# Patient Record
Sex: Female | Born: 1995 | Hispanic: Yes | Marital: Single | Smoking: Never smoker
Health system: Southern US, Community
[De-identification: ages and names within clinical notes are randomized; demographics above are authoritative.]

## PROBLEM LIST (undated history)

## (undated) ENCOUNTER — Inpatient Hospital Stay (HOSPITAL_COMMUNITY): Payer: Self-pay

## (undated) DIAGNOSIS — Z789 Other specified health status: Secondary | ICD-10-CM

## (undated) HISTORY — PX: NO PAST SURGERIES: SHX2092

---

## 2017-11-18 NOTE — L&D Delivery Note (Addendum)
Patient is 22 y.o. 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   Delivery Note At 2:13 PM a viable female was delivered via Vaginal, Spontaneous (Presentation: vertex, OP).  APGAR: 8, 9; weight  pending.   Placenta status: spontaeous, intact.  Cord: 3VC    Anesthesia:  Epidural Episiotomy: None Lacerations: 2nd degree perineal Suture Repair: 2.0 Est. Blood Loss (mL):  50  Mom to postpartum.  Baby to Couplet care / Skin to Skin.  Upon arrival patient was complete and pushing. She pushed with good maternal effort to deliver a healthy baby boy. Baby delivered without difficulty, nuchal cord x1, delivered through, was noted to have good tone and place on maternal abdomen for oral suctioning, drying and stimulation. Delayed cord clamping performed. Placenta delivered intact with 3V cord. Vaginal canal and perineum was inspected and found to have a second degree perineal laceration which was repaired with 2-0 Vicryl rapide. Pitocin was started and uterus massaged until bleeding slowed. Counts of sharps, instruments, and lap pads were all correct.   Mirian MoPeter Frank, MD PGY-1 8/24/20193:31 PM  Midwife attestation: I was gloved and present for delivery in its entirety and I agree with the above resident's note.  Donette LarryMelanie Waldemar Siegel, CNM 4:16 PM

## 2018-04-27 ENCOUNTER — Encounter (HOSPITAL_COMMUNITY): Payer: Self-pay

## 2018-04-27 LAB — OB RESULTS CONSOLE HEPATITIS B SURFACE ANTIGEN: Hepatitis B Surface Ag: NEGATIVE

## 2018-04-27 LAB — OB RESULTS CONSOLE ABO/RH: RH Type: POSITIVE

## 2018-04-27 LAB — OB RESULTS CONSOLE GC/CHLAMYDIA
CHLAMYDIA, DNA PROBE: NEGATIVE
GC PROBE AMP, GENITAL: NEGATIVE

## 2018-04-27 LAB — OB RESULTS CONSOLE ANTIBODY SCREEN: ANTIBODY SCREEN: NEGATIVE

## 2018-04-27 LAB — OB RESULTS CONSOLE RPR: RPR: NONREACTIVE

## 2018-04-27 LAB — OB RESULTS CONSOLE HIV ANTIBODY (ROUTINE TESTING): HIV: NONREACTIVE

## 2018-04-27 LAB — OB RESULTS CONSOLE RUBELLA ANTIBODY, IGM: Rubella: IMMUNE

## 2018-05-07 ENCOUNTER — Other Ambulatory Visit: Payer: Self-pay | Admitting: Family Medicine

## 2018-05-07 DIAGNOSIS — Q048 Other specified congenital malformations of brain: Secondary | ICD-10-CM

## 2018-05-07 DIAGNOSIS — Z3A33 33 weeks gestation of pregnancy: Secondary | ICD-10-CM

## 2018-05-07 DIAGNOSIS — Z3689 Encounter for other specified antenatal screening: Secondary | ICD-10-CM

## 2018-05-07 DIAGNOSIS — G9389 Other specified disorders of brain: Secondary | ICD-10-CM

## 2018-05-12 ENCOUNTER — Encounter (HOSPITAL_COMMUNITY): Payer: Self-pay | Admitting: *Deleted

## 2018-05-14 ENCOUNTER — Ambulatory Visit (HOSPITAL_COMMUNITY)
Admission: RE | Admit: 2018-05-14 | Discharge: 2018-05-14 | Disposition: A | Discharge status: Home / Self Care | Payer: Medicaid Other | Source: Ambulatory Visit | Attending: Family Medicine | Admitting: Family Medicine

## 2018-05-14 ENCOUNTER — Other Ambulatory Visit (HOSPITAL_COMMUNITY): Payer: Self-pay | Admitting: *Deleted

## 2018-05-14 ENCOUNTER — Encounter (HOSPITAL_COMMUNITY): Payer: Self-pay

## 2018-05-14 DIAGNOSIS — Z363 Encounter for antenatal screening for malformations: Secondary | ICD-10-CM | POA: Insufficient documentation

## 2018-05-14 DIAGNOSIS — O0933 Supervision of pregnancy with insufficient antenatal care, third trimester: Secondary | ICD-10-CM | POA: Insufficient documentation

## 2018-05-14 DIAGNOSIS — G9389 Other specified disorders of brain: Secondary | ICD-10-CM

## 2018-05-14 DIAGNOSIS — Q048 Other specified congenital malformations of brain: Secondary | ICD-10-CM

## 2018-05-14 DIAGNOSIS — O358XX0 Maternal care for other (suspected) fetal abnormality and damage, not applicable or unspecified: Secondary | ICD-10-CM | POA: Diagnosis present

## 2018-05-14 DIAGNOSIS — Z3A33 33 weeks gestation of pregnancy: Secondary | ICD-10-CM

## 2018-05-14 DIAGNOSIS — Z3689 Encounter for other specified antenatal screening: Secondary | ICD-10-CM

## 2018-05-14 HISTORY — DX: Other specified health status: Z78.9

## 2018-06-08 LAB — OB RESULTS CONSOLE GBS: GBS: NEGATIVE

## 2018-06-08 LAB — OB RESULTS CONSOLE GC/CHLAMYDIA
Chlamydia: NEGATIVE
GC PROBE AMP, GENITAL: NEGATIVE

## 2018-06-12 ENCOUNTER — Ambulatory Visit (HOSPITAL_COMMUNITY)
Admission: RE | Admit: 2018-06-12 | Discharge: 2018-06-12 | Disposition: A | Discharge status: Home / Self Care | Payer: Medicaid Other | Source: Ambulatory Visit | Attending: Family Medicine | Admitting: Family Medicine

## 2018-06-12 ENCOUNTER — Encounter (HOSPITAL_COMMUNITY): Payer: Self-pay

## 2018-06-12 DIAGNOSIS — O358XX0 Maternal care for other (suspected) fetal abnormality and damage, not applicable or unspecified: Secondary | ICD-10-CM | POA: Diagnosis not present

## 2018-06-12 DIAGNOSIS — G9389 Other specified disorders of brain: Secondary | ICD-10-CM

## 2018-06-12 DIAGNOSIS — O0933 Supervision of pregnancy with insufficient antenatal care, third trimester: Secondary | ICD-10-CM

## 2018-06-12 DIAGNOSIS — Z3A37 37 weeks gestation of pregnancy: Secondary | ICD-10-CM | POA: Diagnosis not present

## 2018-06-12 DIAGNOSIS — Q048 Other specified congenital malformations of brain: Secondary | ICD-10-CM

## 2018-06-12 DIAGNOSIS — Z362 Encounter for other antenatal screening follow-up: Secondary | ICD-10-CM

## 2018-06-23 ENCOUNTER — Telehealth (HOSPITAL_COMMUNITY): Payer: Self-pay | Admitting: *Deleted

## 2018-06-23 ENCOUNTER — Encounter (HOSPITAL_COMMUNITY): Payer: Self-pay | Admitting: *Deleted

## 2018-06-23 NOTE — Telephone Encounter (Signed)
Preadmission screen Interpreter number [phone]

## 2018-06-24 ENCOUNTER — Observation Stay (HOSPITAL_COMMUNITY)
Admission: RE | Admit: 2018-06-24 | Discharge: 2018-06-24 | Disposition: A | Discharge status: Home / Self Care | Payer: Self-pay | Source: Ambulatory Visit | Attending: Family Medicine | Admitting: Family Medicine

## 2018-06-24 ENCOUNTER — Encounter (HOSPITAL_COMMUNITY): Payer: Self-pay

## 2018-06-24 DIAGNOSIS — Z3A38 38 weeks gestation of pregnancy: Secondary | ICD-10-CM | POA: Insufficient documentation

## 2018-06-24 DIAGNOSIS — Z0379 Encounter for other suspected maternal and fetal conditions ruled out: Principal | ICD-10-CM | POA: Insufficient documentation

## 2018-06-24 LAB — CBC
HEMATOCRIT: 34.3 % — AB (ref 36.0–46.0)
Hemoglobin: 11.5 g/dL — ABNORMAL LOW (ref 12.0–15.0)
MCH: 28.1 pg (ref 26.0–34.0)
MCHC: 33.5 g/dL (ref 30.0–36.0)
MCV: 83.9 fL (ref 78.0–100.0)
Platelets: 200 10*3/uL (ref 150–400)
RBC: 4.09 MIL/uL (ref 3.87–5.11)
RDW: 14.8 % (ref 11.5–15.5)
WBC: 8.4 10*3/uL (ref 4.0–10.5)

## 2018-06-24 LAB — TYPE AND SCREEN
ABO/RH(D): A POS
ANTIBODY SCREEN: NEGATIVE

## 2018-06-24 LAB — ABO/RH: ABO/RH(D): A POS

## 2018-06-24 LAB — RPR: RPR Ser Ql: NONREACTIVE

## 2018-06-24 MED ORDER — TERBUTALINE SULFATE 1 MG/ML IJ SOLN
0.2500 mg | Freq: Once | INTRAMUSCULAR | Status: DC
  Filled 2018-06-24: qty 1

## 2018-06-24 MED ORDER — LACTATED RINGERS IV SOLN: INTRAVENOUS | Status: DC

## 2018-06-24 NOTE — H&P (Signed)
HISTORY AND PHYSICAL  Kristin Cobb is a 22 y.o. female G2P1001 with IUP at [redacted]w[redacted]d presented for External Cephalic Version  Prenatal History/Complications:  Past Medical History: Past Medical History:  Diagnosis Date  . Medical history non-contributory     Past Surgical History: Past Surgical History:  Procedure Laterality Date  . NO PAST SURGERIES      Obstetrical History: OB History    Gravida  2   Para  1   Term  1   Preterm      AB      Living  1     SAB      TAB      Ectopic      Multiple      Live Births  1           Social History: Social History   Socioeconomic History  . Marital status: Single    Spouse name: Not on file  . Number of children: Not on file  . Years of education: Not on file  . Highest education level: Not on file  Occupational History  . Not on file  Social Needs  . Financial resource strain: Not on file  . Food insecurity:    Worry: Not on file    Inability: Not on file  . Transportation needs:    Medical: Not on file    Non-medical: Not on file  Tobacco Use  . Smoking status: Never Smoker  . Smokeless tobacco: Never Used  Substance and Sexual Activity  . Alcohol use: Not Currently  . Drug use: Never  . Sexual activity: Not Currently    Birth control/protection: None  Lifestyle  . Physical activity:    Days per week: Not on file    Minutes per session: Not on file  . Stress: Not on file  Relationships  . Social connections:    Talks on phone: Not on file    Gets together: Not on file    Attends religious service: Not on file    Active member of club or organization: Not on file    Attends meetings of clubs or organizations: Not on file    Relationship status: Not on file  Other Topics Concern  . Not on file  Social History Narrative  . Not on file    Family History: Family History  Problem Relation Age of Onset  . Diabetes Mother     Allergies: No Known Allergies  Medications Prior  to Admission  Medication Sig Dispense Refill Last Dose  . Prenatal Vit w/Fe-Methylfol-FA (PNV PO) Take by mouth.   Taking     Review of Systems   All systems reviewed and negative except as stated in HPI  Blood pressure 102/69, pulse 88, temperature 97.6 F (36.4 C), temperature source Oral, resp. rate 16, height 5\' 5"  (1.651 m), weight 147 lb 5 oz (66.8 kg), last menstrual period 09/25/2017. General appearance: alert and cooperative Lungs: clear to auscultation bilaterally Heart: regular rate and rhythm Abdomen: soft, non-tender; bowel sounds normal. Gravid Extremities: Homans sign is negative, no sign of DVT  Presentation: cephalic- performed BSUS that showed fetus was cephalic Fetal monitoringBaseline: 145 bpm, Variability: Good {> 6 bpm), Accelerations: Reactive and Decelerations: Absent Uterine activityNone   Prenatal labs: ABO, Rh: --/--/A POS (08/07 0454) Antibody: PENDING (08/07 0981) Rubella: Immune (06/10 0000) RPR: Nonreactive (06/10 0000)  HBsAg: Negative (06/10 0000)  HIV: Non-reactive (06/10 0000)  GBS: Negative (07/22 0000)   Labs:  Results for orders placed or performed during the hospital encounter of 06/24/18 (from the past 24 hour(s))  CBC   Collection Time: 06/24/18  8:19 AM  Result Value Ref Range   WBC 8.4 4.0 - 10.5 K/uL   RBC 4.09 3.87 - 5.11 MIL/uL   Hemoglobin 11.5 (L) 12.0 - 15.0 g/dL   HCT 16.134.3 (L) 09.636.0 - 04.546.0 %   MCV 83.9 78.0 - 100.0 fL   MCH 28.1 26.0 - 34.0 pg   MCHC 33.5 30.0 - 36.0 g/dL   RDW 40.914.8 81.111.5 - 91.415.5 %   Platelets 200 150 - 400 K/uL  Type and screen [facility]   Collection Time: 06/24/18  8:19 AM  Result Value Ref Range   ABO/RH(D) A POS    Antibody Screen PENDING    Sample Expiration      06/27/2018 Performed at [facility], [address] [place], [place] [address]     Assessment/Plan:  Kristin Cobb is a 22 y.o. G2P1001 at 4225w6d here for ECV  #ECV- not necessary due to fetus  being vertex by US. Discussed with patient labor precautions and reasons to return to hospital. Reviewed possible IOL if she has not delivered by 8/22 (41wk). Utilized interpreter to assure understanding.   Federico FlakeKimberly Niles Newton, MD  06/24/2018, 9:50 AM

## 2018-06-24 NOTE — Discharge Summary (Signed)
Discharge Summary  Admit 06/24/18 Discharge: 06/24/18   Kristin MunroMaria Dominguez Torres is G2P1001 seen on LD for ECv given tranverse position on US 8/6. Today infant is vertex. No ECV needed.    Discharged: Home Diet: no restrictions

## 2018-06-24 NOTE — Discharge Instructions (Signed)
Come to the MAU (maternity admission unit) for 1) Strong contractions every 2-3 minutes for at least 1 hour that do not go away when you drink water or take a warm shower. These contractions will be so strong all you can do is breath through them 2) Vaginal bleeding- anything more than spotting 3) Loss of fluid like you broke your water 4) Decreased movement of your baby    Kristin PeltonBraxton Hicks Contractions Contractions of the uterus can occur throughout pregnancy, but they are not always a sign that you are in labor. You may have practice contractions called Braxton Hicks contractions. These false labor contractions are sometimes confused with true labor. What are Kristin PeltonBraxton Hicks contractions? Braxton Hicks contractions are tightening movements that occur in the muscles of the uterus before labor. Unlike true labor contractions, these contractions do not result in opening (dilation) and thinning of the cervix. Toward the end of pregnancy (32-34 weeks), Braxton Hicks contractions can happen more often and may become stronger. These contractions are sometimes difficult to tell apart from true labor because they can be very uncomfortable. You should not feel embarrassed if you go to the hospital with false labor. Sometimes, the only way to tell if you are in true labor is for your health care provider to look for changes in the cervix. The health care provider will do a physical exam and may monitor your contractions. If you are not in true labor, the exam should show that your cervix is not dilating and your water has not broken. If there are other health problems associated with your pregnancy, it is completely safe for you to be sent home with false labor. You may continue to have Braxton Hicks contractions until you go into true labor. How to tell the difference between true labor and false labor True labor  Contractions last 30-70 seconds.  Contractions become very regular.  Discomfort is usually felt  in the top of the uterus, and it spreads to the lower abdomen and low back.  Contractions do not go away with walking.  Contractions usually become more intense and increase in frequency.  The cervix dilates and gets thinner. False labor  Contractions are usually shorter and not as strong as true labor contractions.  Contractions are usually irregular.  Contractions are often felt in the front of the lower abdomen and in the groin.  Contractions may go away when you walk around or change positions while lying down.  Contractions get weaker and are shorter-lasting as time goes on.  The cervix usually does not dilate or become thin. Follow these instructions at home:  Take over-the-counter and prescription medicines only as told by your health care provider.  Keep up with your usual exercises and follow other instructions from your health care provider.  Eat and drink lightly if you think you are going into labor.  If Braxton Hicks contractions are making you uncomfortable: ? Change your position from lying down or resting to walking, or change from walking to resting. ? Sit and rest in a tub of warm water. ? Drink enough fluid to keep your urine pale yellow. Dehydration may cause these contractions. ? Do slow and deep breathing several times an hour.  Keep all follow-up prenatal visits as told by your health care provider. This is important. Contact a health care provider if:  You have a fever.  You have continuous pain in your abdomen. Get help right away if:  Your contractions become stronger, more regular, and closer  together.  You have fluid leaking or gushing from your vagina.  You pass blood-tinged mucus (bloody show).  You have bleeding from your vagina.  You have low back pain that you never had before.  You feel your babys head pushing down and causing pelvic pressure.  Your baby is not moving inside you as much as it used to. Summary  Contractions  that occur before labor are called Braxton Hicks contractions, false labor, or practice contractions.  Braxton Hicks contractions are usually shorter, weaker, farther apart, and less regular than true labor contractions. True labor contractions usually become progressively stronger and regular and they become more frequent.  Manage discomfort from [facility] contractions by changing position, resting in a warm bath, drinking plenty of water, or practicing deep breathing. This information is not intended to replace advice given to you by your health care provider. Make sure you discuss any questions you have with your health care provider. Document Released: 03/20/2017 Document Revised: 03/20/2017 Document Reviewed: 03/20/2017 Elsevier Interactive Patient Education  2018 [employer].

## 2018-07-06 ENCOUNTER — Telehealth (HOSPITAL_COMMUNITY): Payer: Self-pay | Admitting: *Deleted

## 2018-07-06 NOTE — Telephone Encounter (Signed)
Preadmission screen Interpreter [phone]

## 2018-07-11 ENCOUNTER — Other Ambulatory Visit: Payer: Self-pay

## 2018-07-11 ENCOUNTER — Inpatient Hospital Stay (HOSPITAL_COMMUNITY): Payer: Medicaid Other | Admitting: Anesthesiology

## 2018-07-11 ENCOUNTER — Inpatient Hospital Stay (HOSPITAL_COMMUNITY)
Admission: RE | Admit: 2018-07-11 | Discharge: 2018-07-13 | DRG: 807 | Disposition: A | Discharge status: Home / Self Care | Payer: Medicaid Other | Attending: Obstetrics and Gynecology | Admitting: Obstetrics and Gynecology

## 2018-07-11 ENCOUNTER — Encounter (HOSPITAL_COMMUNITY): Payer: Self-pay

## 2018-07-11 DIAGNOSIS — D649 Anemia, unspecified: Secondary | ICD-10-CM | POA: Diagnosis present

## 2018-07-11 DIAGNOSIS — O480 Post-term pregnancy: Principal | ICD-10-CM | POA: Diagnosis present

## 2018-07-11 DIAGNOSIS — Z141 Cystic fibrosis carrier: Secondary | ICD-10-CM | POA: Diagnosis not present

## 2018-07-11 DIAGNOSIS — Z3A41 41 weeks gestation of pregnancy: Secondary | ICD-10-CM | POA: Diagnosis present

## 2018-07-11 DIAGNOSIS — O99019 Anemia complicating pregnancy, unspecified trimester: Secondary | ICD-10-CM | POA: Diagnosis present

## 2018-07-11 DIAGNOSIS — O9902 Anemia complicating childbirth: Secondary | ICD-10-CM | POA: Diagnosis present

## 2018-07-11 LAB — TYPE AND SCREEN
ABO/RH(D): A POS
Antibody Screen: NEGATIVE

## 2018-07-11 LAB — CBC
HCT: 33.7 % — ABNORMAL LOW (ref 36.0–46.0)
HEMOGLOBIN: 11.5 g/dL — AB (ref 12.0–15.0)
MCH: 28.2 pg (ref 26.0–34.0)
MCHC: 34.1 g/dL (ref 30.0–36.0)
MCV: 82.6 fL (ref 78.0–100.0)
PLATELETS: 208 10*3/uL (ref 150–400)
RBC: 4.08 MIL/uL (ref 3.87–5.11)
RDW: 15 % (ref 11.5–15.5)
WBC: 8.2 10*3/uL (ref 4.0–10.5)

## 2018-07-11 LAB — RPR: RPR Ser Ql: NONREACTIVE

## 2018-07-11 MED ORDER — TERBUTALINE SULFATE 1 MG/ML IJ SOLN
0.2500 mg | Freq: Once | INTRAMUSCULAR | Status: DC | PRN
  Filled 2018-07-11: qty 1

## 2018-07-11 MED ORDER — BENZOCAINE-MENTHOL 20-0.5 % EX AERO
1.0000 "application " | INHALATION_SPRAY | CUTANEOUS | Status: DC | PRN
  Administered 2018-07-11: 1 via TOPICAL
  Filled 2018-07-11: qty 56

## 2018-07-11 MED ORDER — PHENYLEPHRINE 40 MCG/ML (10ML) SYRINGE FOR IV PUSH (FOR BLOOD PRESSURE SUPPORT)
80.0000 ug | PREFILLED_SYRINGE | INTRAVENOUS | Status: DC | PRN
  Filled 2018-07-11: qty 5

## 2018-07-11 MED ORDER — MISOPROSTOL 25 MCG QUARTER TABLET: 25.0000 ug | ORAL_TABLET | ORAL | Status: DC | PRN

## 2018-07-11 MED ORDER — EPHEDRINE 5 MG/ML INJ
10.0000 mg | INTRAVENOUS | Status: DC | PRN
  Filled 2018-07-11: qty 2

## 2018-07-11 MED ORDER — DIBUCAINE 1 % RE OINT: 1.0000 "application " | TOPICAL_OINTMENT | RECTAL | Status: DC | PRN

## 2018-07-11 MED ORDER — SOD CITRATE-CITRIC ACID 500-334 MG/5ML PO SOLN: 30.0000 mL | ORAL | Status: DC | PRN

## 2018-07-11 MED ORDER — DIPHENHYDRAMINE HCL 25 MG PO CAPS: 25.0000 mg | ORAL_CAPSULE | Freq: Four times a day (QID) | ORAL | Status: DC | PRN

## 2018-07-11 MED ORDER — LIDOCAINE HCL (PF) 1 % IJ SOLN
INTRAMUSCULAR | Status: DC | PRN
  Administered 2018-07-11 (×2): 5 mL via EPIDURAL

## 2018-07-11 MED ORDER — ONDANSETRON HCL 4 MG/2ML IJ SOLN: 4.0000 mg | INTRAMUSCULAR | Status: DC | PRN

## 2018-07-11 MED ORDER — ACETAMINOPHEN 325 MG PO TABS: 650.0000 mg | ORAL_TABLET | ORAL | Status: DC | PRN

## 2018-07-11 MED ORDER — SENNOSIDES-DOCUSATE SODIUM 8.6-50 MG PO TABS
2.0000 | ORAL_TABLET | ORAL | Status: DC
  Administered 2018-07-11 – 2018-07-12 (×2): 2 via ORAL
  Filled 2018-07-11 (×2): qty 2

## 2018-07-11 MED ORDER — LIDOCAINE HCL (PF) 1 % IJ SOLN
30.0000 mL | INTRAMUSCULAR | Status: DC | PRN
  Filled 2018-07-11: qty 30

## 2018-07-11 MED ORDER — SIMETHICONE 80 MG PO CHEW: 80.0000 mg | CHEWABLE_TABLET | ORAL | Status: DC | PRN

## 2018-07-11 MED ORDER — OXYTOCIN 40 UNITS IN LACTATED RINGERS INFUSION - SIMPLE MED
2.5000 [IU]/h | INTRAVENOUS | Status: DC
  Filled 2018-07-11: qty 1000

## 2018-07-11 MED ORDER — MISOPROSTOL 50MCG HALF TABLET
50.0000 ug | ORAL_TABLET | ORAL | Status: DC | PRN
  Administered 2018-07-11: 50 ug via ORAL
  Filled 2018-07-11 (×2): qty 1

## 2018-07-11 MED ORDER — WITCH HAZEL-GLYCERIN EX PADS: 1.0000 "application " | MEDICATED_PAD | CUTANEOUS | Status: DC | PRN

## 2018-07-11 MED ORDER — DIPHENHYDRAMINE HCL 50 MG/ML IJ SOLN: 12.5000 mg | INTRAMUSCULAR | Status: DC | PRN

## 2018-07-11 MED ORDER — OXYCODONE-ACETAMINOPHEN 5-325 MG PO TABS: 1.0000 | ORAL_TABLET | ORAL | Status: DC | PRN

## 2018-07-11 MED ORDER — ZOLPIDEM TARTRATE 5 MG PO TABS: 5.0000 mg | ORAL_TABLET | Freq: Every evening | ORAL | Status: DC | PRN

## 2018-07-11 MED ORDER — FENTANYL CITRATE (PF) 100 MCG/2ML IJ SOLN: 100.0000 ug | INTRAMUSCULAR | Status: DC | PRN

## 2018-07-11 MED ORDER — OXYTOCIN 40 UNITS IN LACTATED RINGERS INFUSION - SIMPLE MED
1.0000 m[IU]/min | INTRAVENOUS | Status: DC
  Administered 2018-07-11: 2 m[IU]/min via INTRAVENOUS

## 2018-07-11 MED ORDER — LACTATED RINGERS IV SOLN
INTRAVENOUS | Status: DC
  Administered 2018-07-11 (×3): via INTRAVENOUS

## 2018-07-11 MED ORDER — PRENATAL MULTIVITAMIN CH
1.0000 | ORAL_TABLET | Freq: Every day | ORAL | Status: DC
  Administered 2018-07-12: 1 via ORAL
  Filled 2018-07-11: qty 1

## 2018-07-11 MED ORDER — ONDANSETRON HCL 4 MG PO TABS: 4.0000 mg | ORAL_TABLET | ORAL | Status: DC | PRN

## 2018-07-11 MED ORDER — ONDANSETRON HCL 4 MG/2ML IJ SOLN: 4.0000 mg | Freq: Four times a day (QID) | INTRAMUSCULAR | Status: DC | PRN

## 2018-07-11 MED ORDER — OXYCODONE-ACETAMINOPHEN 5-325 MG PO TABS: 2.0000 | ORAL_TABLET | ORAL | Status: DC | PRN

## 2018-07-11 MED ORDER — LACTATED RINGERS IV SOLN
500.0000 mL | INTRAVENOUS | Status: DC | PRN
  Administered 2018-07-11: 500 mL via INTRAVENOUS

## 2018-07-11 MED ORDER — PHENYLEPHRINE 40 MCG/ML (10ML) SYRINGE FOR IV PUSH (FOR BLOOD PRESSURE SUPPORT)
PREFILLED_SYRINGE | INTRAVENOUS | Status: AC
  Filled 2018-07-11: qty 20

## 2018-07-11 MED ORDER — COCONUT OIL OIL: 1.0000 "application " | TOPICAL_OIL | Status: DC | PRN

## 2018-07-11 MED ORDER — OXYTOCIN BOLUS FROM INFUSION
500.0000 mL | Freq: Once | INTRAVENOUS | Status: AC
  Administered 2018-07-11: 500 mL via INTRAVENOUS

## 2018-07-11 MED ORDER — FENTANYL 2.5 MCG/ML BUPIVACAINE 1/10 % EPIDURAL INFUSION (WH - ANES)
14.0000 mL/h | INTRAMUSCULAR | Status: DC | PRN
  Administered 2018-07-11: 14 mL/h via EPIDURAL

## 2018-07-11 MED ORDER — FENTANYL 2.5 MCG/ML BUPIVACAINE 1/10 % EPIDURAL INFUSION (WH - ANES)
INTRAMUSCULAR | Status: AC
  Filled 2018-07-11: qty 100

## 2018-07-11 MED ORDER — LACTATED RINGERS IV SOLN: 500.0000 mL | Freq: Once | INTRAVENOUS | Status: DC

## 2018-07-11 MED ORDER — TETANUS-DIPHTH-ACELL PERTUSSIS 5-2.5-18.5 LF-MCG/0.5 IM SUSP: 0.5000 mL | Freq: Once | INTRAMUSCULAR | Status: DC

## 2018-07-11 MED ORDER — IBUPROFEN 600 MG PO TABS
600.0000 mg | ORAL_TABLET | Freq: Four times a day (QID) | ORAL | Status: DC
  Administered 2018-07-11 – 2018-07-13 (×8): 600 mg via ORAL
  Filled 2018-07-11 (×8): qty 1

## 2018-07-11 NOTE — Progress Notes (Signed)
[employer] Tool WorksSpanish Interpreter, Dominque [phone] on the line

## 2018-07-11 NOTE — Progress Notes (Signed)
Spanish Interpreter Gloriann LoanJanina [phone] on the line to see if the patient had any needs/questions for the induction process thus far

## 2018-07-11 NOTE — Progress Notes (Signed)
Spanish interpreter, Dois DavenportSandra [phone], on the line to discuss POC with Clelia CroftShaw, CNM and for foley bulb placement

## 2018-07-11 NOTE — Anesthesia Preprocedure Evaluation (Signed)
Anesthesia Evaluation  Patient identified by MRN, date of birth, ID band Patient awake    Reviewed: Allergy & Precautions, H&P , NPO status , Patient's Chart, lab work & pertinent test results  Airway Mallampati: I  TM Distance: >3 FB Neck ROM: full    Dental no notable dental hx. (+) Teeth Intact   Pulmonary neg pulmonary ROS,    Pulmonary exam normal breath sounds clear to auscultation       Cardiovascular negative cardio ROS Normal cardiovascular exam Rhythm:regular Rate:Normal     Neuro/Psych negative neurological ROS  negative psych ROS   GI/Hepatic negative GI ROS, Neg liver ROS,   Endo/Other  negative endocrine ROS  Renal/GU negative Renal ROS  negative genitourinary   Musculoskeletal negative musculoskeletal ROS (+)   Abdominal Normal abdominal exam  (+)   Peds  Hematology  (+) anemia ,   Anesthesia Other Findings   Reproductive/Obstetrics (+) Pregnancy                             Anesthesia Physical Anesthesia Plan  ASA: II  Anesthesia Plan: Epidural   Post-op Pain Management:    Induction:   PONV Risk Score and Plan:   Airway Management Planned:   Additional Equipment:   Intra-op Plan:   Post-operative Plan:   Informed Consent: I have reviewed the patients History and Physical, chart, labs and discussed the procedure including the risks, benefits and alternatives for the proposed anesthesia with the patient or authorized representative who has indicated his/her understanding and acceptance.     Plan Discussed with:   Anesthesia Plan Comments:         Anesthesia Quick Evaluation

## 2018-07-11 NOTE — Progress Notes (Signed)
Interpreter Rozetta NunneryYendry [phone] on the line for admission process

## 2018-07-11 NOTE — Anesthesia Pain Management Evaluation Note (Signed)
  CRNA Pain Management Visit Note  Patient: Kristin Cobb, 22 y.o., female  "Hello I am a member of the anesthesia team at [facility]. We have an anesthesia team available at all times to provide care throughout the hospital, including epidural management and anesthesia for C-section. I don't know your plan for the delivery whether it a natural birth, water birth, IV sedation, nitrous supplementation, doula or epidural, but we want to meet your pain goals."   1.Was your pain managed to your expectations on prior hospitalizations?   Yes   2.What is your expectation for pain management during this hospitalization?     Epidural  3.How can we help you reach that goal? Support prn   Record the patient's initial score and the patient's pain goal.   Pain: 4  Pain Goal: 6 [facility] wants you to be able to say your pain was always managed very well.  [facility] LLCWRINKLE,Spiro Ausborn 07/11/2018

## 2018-07-11 NOTE — Progress Notes (Signed)
Patient ID: [name], female   DOB: 11/15/1996, 22 y.o.   MRN: [id number]   Visit conducted with interpreter  Pt having some cramping in low abd  BP 108/70, P 89 FHR 150s, +accels, no decels Ctx irreg Cx 1+/70/vtx -2  IUP@term  Cx unfavorable  Cervical foley inserted without difficulty and inflated with 60cc fluid Plan on Pit when it comes out  Cam HaiSHAW, Likisha Alles CNM 07/11/2018 6:40 AM

## 2018-07-11 NOTE — Anesthesia Procedure Notes (Signed)
Epidural Patient location during procedure: OB Start time: 07/11/2018 9:54 AM End time: 07/11/2018 9:58 AM  Staffing Anesthesiologist: Leilani AbleHatchett, Maretta Overdorf, MD Performed: anesthesiologist   Preanesthetic Checklist Completed: patient identified, site marked, surgical consent, pre-op evaluation, timeout performed, IV checked, risks and benefits discussed and monitors and equipment checked  Epidural Patient position: sitting Prep: site prepped and draped and DuraPrep Patient monitoring: continuous pulse ox and blood pressure Approach: midline Location: L3-L4 Injection technique: LOR air  Needle:  Needle type: Tuohy  Needle gauge: 17 G Needle length: 9 cm and 9 Needle insertion depth: 4 cm Catheter type: closed end flexible Catheter size: 19 Gauge Catheter at skin depth: 9 cm Test dose: negative and Other  Assessment Sensory level: T10 Events: blood not aspirated, injection not painful, no injection resistance, negative IV test and no paresthesia  Additional Notes Reason for block:procedure for pain

## 2018-07-11 NOTE — H&P (Addendum)
Kristin MunroMaria Dominguez Cobb is a 22 y.o. female G2P1001 @ 41.2wks presenting for IOL for post dates. Denies leaking or bldg; no N/V, H/A or visual disturbances.  *Patient needs spanish interpretor OB History    Gravida  2   Para  1   Term  1   Preterm      AB      Living  1     SAB      TAB      Ectopic      Multiple      Live Births  1          Past Medical History:  Diagnosis Date  . Medical history non-contributory    Past Surgical History:  Procedure Laterality Date  . NO PAST SURGERIES     Family History: family history includes Diabetes in her mother. Social History:  reports that she has never smoked. She has never used smokeless tobacco. She reports that she drank alcohol. She reports that she does not use drugs.     Maternal Diabetes: No Genetic Screening: Abnormal:  Results: Other: CF carrier, too late for other screens Maternal Ultrasounds/Referrals: Abnormal:  Findings:   Other: increased cisterna magna, note had said refer to MFM but no record of that visit in EPIC Fetal Ultrasounds or other Referrals:  None Maternal Substance Abuse:  No Significant Maternal Medications:  None Significant Maternal Lab Results:  None Other Comments:  hx of transverse lie, was admitted for version by Dr. Alvester MorinNewton and found to be vertex so discharged  ROS History Dilation: Fingertip Effacement (%): Thick Station: -3, -2 Exam by:: Bland, DO Blood pressure 117/73, pulse 93, temperature 98.2 F (36.8 C), temperature source Oral, resp. rate 16, height 5\' 5"  (1.651 m), weight 69.1 kg, last menstrual period 09/25/2017.  Vertex by cervical exam, will confirm with US Exam Physical Exam  Prenatal labs: ABO, Rh: --/--/A POS Performed at [facility], [address] [place], [place] [address]  [phone](08/07 0820) Antibody: NEG (08/07 40980819) Rubella: Immune (06/10 0000) RPR: Non Reactive (08/07 0819)  HBsAg: Negative (06/10 0000)  HIV: Non-reactive (06/10 0000)  GBS:  Negative (07/22 0000)   Assessment/Plan: IOL for postdates Patient declines epidural at this but aware it is option Will start cytotec, will discuss pitocin with patient at appropriate time  *CF carrier.  Also w/ increased cisterna magna on US   Marthenia RollingScott Bland, DO 07/11/2018, 1:47 AM   CNM attestation:  I have seen and examined this patient; I agree with above documentation in the resident's note.   Kristin MunroMaria Dominguez Cobb is a 22 y.o. G2P1001 here for IOL due to postdates  PE: BP 108/70   Pulse 89   Temp 97.9 F (36.6 C) (Oral)   Resp 18   Ht 5\' 5"  (1.651 m)   Wt 69.1 kg   LMP 09/25/2017   BMI 25.36 kg/m  Gen: calm comfortable, NAD Resp: normal effort, no distress Abd: gravid  ROS, labs, PMH reviewed  [facility] Plan on cervical ripening with a likely combination of cytotec, cervical foley, and Pit/AROM Anticipate SVD Info re fetal increased cisterna magna passed on to peds via Leda RoysITT  Larken Urias CNM 07/11/2018, 6:41 AM

## 2018-07-11 NOTE — Progress Notes (Signed)
Hospital interpreter at bedside. Went over POC for tonight and teaching with pt. Pt verbalized understanding. All questions and concerns are addressed

## 2018-07-12 NOTE — Clinical Social Work Maternal (Signed)
CLINICAL SOCIAL WORK MATERNAL/CHILD NOTE  Patient Details  Name: Kristin Cobb MRN: [id number] Date of Birth: 04/02/1996  Date:  07/12/2018  Clinical Social Worker Initiating Note:  Durelle Zepeda, MSW, LCSW-A Date/Time: Initiated:  07/12/18/1342     Child's Name:  Kristin Cobb    Biological Parents:  Mother, Father   Need for Interpreter:  Spanish   Reason for Referral:  Late or No Prenatal Care    Address:  [address] [place] [address]    Phone number:  [phone] (home)     Additional phone number: [phone]  Household Members/Support Persons (HM/SP):   Household Member/Support Person 1   HM/SP Name Relationship DOB or Age  HM/SP -1 Kristin Cobb Spouse, FOB [phone]  HM/SP -2        HM/SP -3        HM/SP -4        HM/SP -5        HM/SP -6        HM/SP -7        HM/SP -8          Natural Supports (not living in the home):  Extended Family, Immediate Family   Professional Supports: None   Employment: Unemployed   Type of Work:     Education:  Other (comment)(Did not assess)   Homebound arranged:    Financial Resources:  Medicaid   Other Resources:  WIC, Food Stamps    Cultural/Religious Considerations Which May Impact Care:  Spanish speaking, no religion noted in chart  Strengths:  Ability to meet basic needs , Home prepared for child    Psychotropic Medications:         Pediatrician:       Pediatrician List:   [place]    [place]    [place]    [place]    [place]    [place]      Pediatrician Fax Number:    Risk Factors/Current Problems:      Cognitive State:  Alert    Mood/Affect:  Comfortable , Calm    CSW Assessment: CSW received consult for MOB due to late entry to care at approximately 30 weeks. CSW spoke with MOB to complete assessment using John, Interpreter #[phone]. CSW inquired about late entry to care, MOB reports moving to  [place] from [place] about two and a half months ago. MOB reports that she has a three year old daughter who accompanied MOB and FOB to [place] at that time. MOB reports that her husband is Kristin and he is working here. MOB reports receiving WIC, Medicaid, and Food Stamps. CSW explained to MOB how to get newborn added to all files to begin receiving services. MOB has a new car seat for safe transportation. MOB reports she has a crib at home for safe sleeping, SIDS precautions reviewed. MOB reports not yet choosing a pediatrician because she was informed by RN that she would be given a book to choose a pediatrician from. MOB was educated on hospital drug screening policies regarding late entry to care, MOB stated understanding and did not have questions. MOB reports having all items at home for baby. MOB is breastfeeding, stated it is going very well without concerns. MOB does not work but FOB does work full time. MOB did not have questions or concerns. CSW encouraged MOB to reach out for assistance if needs arise in the future, MOB agreeable.  CSW Plan/Description:  No   Further Intervention Required/No Barriers to Discharge, CSW Will Continue to Monitor Umbilical Cord Tissue Drug Screen Results and Make Report if Warranted, Hospital Drug Screen Policy Information    Kristin Cobb, LCSWA 07/12/2018, 1:44 PM   

## 2018-07-12 NOTE — Lactation Note (Signed)
This note was copied from a baby's chart. Lactation Consultation Note  Patient Name: Kristin Cobb ZOXWR'UToday's Date: 07/12/2018 Reason for consult: Initial assessment;Term;1st time breastfeeding P1, 9 hour female infant  Spanish Speaking Family : nterpreter from [place] used Marquita PalmsMario [phone]) [place] [facility] [place] and attended BF classes.  Per dad, mom BF infant  9 minutes before LC enter their room for 15 minutes. LC did not observe latch. Discussed w/ parents I&O Mom encouraged to feed baby 8-12 times/24 hours and with feeding cues.  LC discussed with parents. Mom made aware of O/P services, breastfeeding support groups, community resources, and our phone # for post-discharge questions.  infant may cluster feed after 24 hours.   Maternal Data Formula Feeding for Exclusion: No Has patient been taught Hand Expression?: Yes(Mom demostrated hand expression LC and colostrum was present.) Does the patient have breastfeeding experience prior to this delivery?: No  Feeding Feeding Type: Breast Fed Length of feed: 9 min  LATCH Score                   Interventions    Lactation Tools Discussed/Used WIC Program: Yes   Consult Status Consult Status: Follow-up Date: 07/12/18 Follow-up type: In-patient    Danelle EarthlyRobin Aleda Madl 07/12/2018, 12:01 AM

## 2018-07-12 NOTE — Lactation Note (Signed)
This note was copied from a baby's chart. Lactation Consultation Note  Patient Name: Kristin [name] ZOXWR'UToday's Date: 07/12/2018 Reason for consult: Follow-up assessment;Term;1st time breastfeeding;Infant weight loss  33 hours old FT female who is still exclusively BF by his mother. Baby is at high intermediate risk zone with a TCB 7 @ 25 hours old and 7.8 @ 33 hours. Offered to set up a DEBP but mom politely declined stating BF is going well, and she's just going to keep putting baby to the breast. Asked mom to let her RN know if she changed her mind and wanted to start pumping.  Family is Spanish speaking and they had multiple questions about different services and WIC, Medicaid and Social security. Asked her RN to let the social worker know that family needs assistance with any further questions related to it. RN will also pass [facility]'s room tomorrow morning prior her discharge.   Encouraged mom to keep feeding baby STS 8-12 times/24 hours or sooner if feeding cues are present. Discussed cluster feeding. Parents are aware of LC services and will call PRN.  Maternal Data    Feeding      Interventions Interventions: Breast feeding basics reviewed  Lactation Tools Discussed/Used     Consult Status Consult Status: Follow-up Date: 07/13/18 Follow-up type: In-patient    Adriana Lina Venetia ConstableS Layton Naves 07/12/2018, 11:20 PM

## 2018-07-12 NOTE — Progress Notes (Addendum)
POSTPARTUM PROGRESS NOTE  Post Partum Day 1 Subjective:  Kristin Cobb is a 22 y.o. [phone] 4175w2d s/p SVD.  No acute events overnight.  Pt denies problems with ambulating, voiding or po intake.  She denies nausea or vomiting.  Pain is moderately controlled.  She has not had flatus. She has not had bowel movement.  Lochia Moderate.   Objective: Blood pressure (!) 89/61, pulse 79, temperature 97.7 F (36.5 C), temperature source Oral, resp. rate 16, height 5\' 5"  (1.651 m), weight 69.1 kg, last menstrual period 09/25/2017, SpO2 97 %, unknown if currently breastfeeding.  Physical Exam:  General: alert, cooperative and no distress Lochia:normal flow Chest: CTAB Heart: RRR no m/r/g Abdomen: +BS, soft, nontender,  Uterine Fundus: firm DVT Evaluation: No calf swelling or tenderness Extremities: no edema  Recent Labs    07/11/18 0108  HGB 11.5*  HCT 33.7*    Assessment/Plan:  ASSESSMENT: Kristin Cobb is a 22 y.o. [phone] 3375w2d s/p SVD following IOL for post dates.  Plan for discharge tomorrow, Breastfeeding and Contraception condoms   LOS: 1 day   Mirian MoPeter Frank, MD 07/12/2018, 9:28 AM   I examined this patient and agree with the above assessment D Hart RochesterLawson cnm

## 2018-07-12 NOTE — Anesthesia Postprocedure Evaluation (Signed)
Anesthesia Post Note  Patient: Kristin MunroMaria Dominguez Cobb  Procedure(s) Performed: AN AD HOC LABOR EPIDURAL     Patient location during evaluation: Mother Baby Anesthesia Type: Epidural Level of consciousness: awake and alert and oriented Pain management: satisfactory to patient Vital Signs Assessment: post-procedure vital signs reviewed and stable Respiratory status: respiratory function stable Cardiovascular status: stable Postop Assessment: no headache, no backache, epidural receding, patient able to bend at knees, no signs of nausea or vomiting and adequate PO intake Anesthetic complications: no    Last Vitals:  Vitals:   07/12/18 0110 07/12/18 0527  BP: (!) 92/59 (!) 89/61  Pulse: 91 79  Resp: 16 16  Temp: 36.5 C 36.5 C  SpO2: 96% 97%    Last Pain:  Vitals:   07/12/18 0528  TempSrc:   PainSc: 0-No pain   Pain Goal: Patients Stated Pain Goal: 6 (07/11/18 0744)               Karleen DolphinFUSSELL,Zamirah Denny

## 2018-07-13 NOTE — Discharge Summary (Addendum)
OB Discharge Summary  Patient Name: Kristin Cobb DOB: [birth year] MRN: [id number]  Date of admission: 07/11/2018 Delivering MD: Mirian Mo   Date of discharge: 07/13/2018  Admitting diagnosis: 39 wks, induction Intrauterine pregnancy: [redacted]w[redacted]d     Secondary diagnosis:   Principal Problem:   [redacted] weeks gestation of pregnancy Active Problems:   Cystic fibrosis carrier   Anemia during pregnancy   Post term pregnancy  Additional problems: none  Discharge diagnosis: Term Pregnancy Delivered                                                                          Post partum procedures:none Augmentation: Pitocin and Cytotec Complications: None  Hospital course:  Induction of Labor With Vaginal Delivery   22 y.o. yo [id number] at [redacted]w[redacted]d was admitted to the hospital 07/11/2018 for induction of labor.  Indication for induction: Postdates.  Patient had an uncomplicated labor course as follows: Membrane Rupture Time/Date:   ,    Intrapartum Procedures: Episiotomy: None [1]                                         Lacerations:  2nd degree [3]  Patient had delivery of a Viable infant.  Information for the patient's newborn:  Kristin, Cobb [[id number]]  Delivery Method: Vaginal, Spontaneous(Filed from Delivery Summary)   Details of delivery can be found in separate delivery note.  Patient had a routine postpartum course. Patient is discharged home 07/13/18.  Physical exam  Vitals:   07/13/18 0439 07/13/18 0445 07/13/18 0523 07/13/18 0528  BP: 101/66 101/66  (!) 92/57  Pulse: 77 68 69 72  Resp:  16 18 14   Temp: 97.9 F (36.6 C) 97.9 F (36.6 C) (!) 97.5 F (36.4 C) (!) 97.5 F (36.4 C)  TempSrc: Oral Oral Oral Oral  SpO2:   98%   Weight:      Height:       General: alert, cooperative and no distress Lochia: appropriate Uterine Fundus: firm Incision: Healing well with no significant drainage DVT Evaluation: No evidence of DVT seen on physical exam. Labs: Lab  Results  Component Value Date   WBC 8.2 07/11/2018   HGB 11.5 (L) 07/11/2018   HCT 33.7 (L) 07/11/2018   MCV 82.6 07/11/2018   PLT 208 07/11/2018   No flowsheet data found.  Discharge instruction: per After Visit Summary and "Baby and Me Booklet".  After visit meds:  Allergies as of 07/13/2018   No Known Allergies     Medication List    TAKE these medications   PNV PO Take by mouth.      Diet: routine diet Activity: Advance as tolerated. Pelvic rest for 6 weeks.  Outpatient follow up:4 weeks - encouraged patient to call and schedule Postpartum contraception: Condoms  Newborn Data: Live born female  Birth Weight: 8 lb 6.2 oz (3805 g) APGAR: 8, 9  Newborn Delivery   Birth date/time:  07/11/2018 14:13:00 Delivery type:  Vaginal, Spontaneous    Baby Feeding: Bottle and Breast Disposition:home with mother  Attestation: I have seen this patient and agree with the resident's documentation. I  have examined them separately.  Cristal DeerLaurel S. Earlene PlaterWallace, DO OB/GYN Fellow

## 2018-07-13 NOTE — Discharge Instructions (Signed)
Parto vaginal, cuidados posteriores  Vaginal Delivery, Care After  Siga estas instrucciones durante las prximas semanas. Estas indicaciones le proporcionan informacin acerca de cmo deber cuidarse despus del parto vaginal. Su mdico tambin podr darle indicaciones ms especficas. El tratamiento ha sido planificado segn las prcticas mdicas actuales, pero en algunos casos pueden ocurrir problemas. Llame al mdico si tiene problemas o preguntas.  Qu puedo esperar despus del procedimiento?  Despus de un parto vaginal, es frecuente tener lo siguiente:   Hemorragia leve de la vagina.   Dolor en el abdomen, la vagina y la zona de la piel entre la abertura vaginal y el ano (perineo).   Calambres plvicos.   Fatiga.    Siga estas indicaciones en su casa:  Medicamentos   Tome los medicamentos de venta libre y los recetados solamente como se lo haya indicado el mdico.   Si le recetaron un antibitico, tmelo como se lo haya indicado el mdico. No interrumpa la administracin del antibitico hasta que lo haya terminado.  Conducir     No conduzca ni opere maquinaria pesada mientras toma analgsicos recetados.   No conduzca durante 24horas si le administraron un sedante.  Estilo de vida   No beba alcohol. Esto es de suma importancia si est amamantando o toma analgsicos.   No consuma productos que contengan tabaco, incluidos cigarrillos, tabaco de mascar o cigarrillos electrnicos. Si necesita ayuda para dejar de fumar, consulte al mdico.  Qu debe comer y beber   Beba al menos 8vasos de ochoonzas (240cc) de agua todos los das a menos que el mdico le indique lo contrario. Si elige amamantar al beb, quiz deba beber an ms cantidad de agua.   Coma alimentos ricos en fibras todos los das. Estos alimentos pueden ayudarla a prevenir o aliviar el estreimiento. Los alimentos ricos en fibras incluyen, entre otros:  ? Panes y cereales integrales.  ? Arroz integral.  ? Frijoles.  ? Frutas y verduras  frescas.  Actividad   Retome sus actividades normales como se lo haya indicado el mdico. Pregntele al mdico qu actividades son seguras para usted.   Descanse todo lo que pueda. Trate de descansar o tomar una siesta mientras el beb est durmiendo.   No levante objetos que pesen ms que su beb o 10libras (4,5kg) hasta que el mdico le diga que es seguro.   Hable con el mdico sobre cundo puede retomar la actividad sexual. Esto puede depender de lo siguiente:  ? Riesgo de sufrir una infeccin.  ? Velocidad de cicatrizacin.  ? Comodidad y deseo de retomar la actividad sexual.  Cuidados vaginales   Si le realizaron una episiotoma o tuvo un desgarro vaginal, contrlese la zona todos los das para detectar signos de infeccin. Est atenta a los siguientes signos:  ? Aumento del enrojecimiento, la hinchazn o el dolor.  ? Mayor presencia de lquido o sangre.  ? Calor.  ? Pus o mal olor.   No use tampones ni se haga duchas vaginales hasta que el mdico la autorice.   Controle la sangre que elimina por la vagina para detectar cogulos de sangre. Estos pueden tener el aspecto de grumos de color rojo oscuro, o secrecin marrn o negra.  Instrucciones generales   Mantenga el perineo limpio y seco, como se lo haya indicado el mdico.   Use ropa cmoda y suelta.   Cuando vaya al bao, siempre higiencese de adelante hacia atrs.   Pregntele al mdico si puede ducharse o tomar baos de inmersin.   Si se le realiz una episiotoma o tuvo un desgarro perineal durante el trabajo del parto o el parto, es posible que el mdico le indique que no tome baos de inmersin durante un determinado tiempo.   Use un sostn que sujete y ajuste bien sus pechos.   Si es posible, pdale a alguien que la ayude con las tareas del hogar y a cuidar del beb durante al menos algunos das despus de que le den el alta del hospital.   Concurra a todas las visitas de seguimiento para usted y el beb, como se lo haya indicado el  mdico. Esto es importante.  Comunquese con un mdico si:   Tiene los siguientes sntomas:  ? Secrecin vaginal que tiene mal olor.  ? Dificultad para orinar.  ? Dolor al orinar.  ? Aumento o disminucin repentinos de la frecuencia de las deposiciones.  ? Ms enrojecimiento, hinchazn o dolor alrededor de la episiotoma o del desgarro vaginal.  ? Ms secrecin de lquido o sangre de la episiotoma o del desgarro vaginal.  ? Pus o mal olor proveniente de la episiotoma o del desgarro vaginal.  ? Fiebre.  ? Erupcin cutnea.  ? Poco inters o falta de inters en actividades que solan gustarle.  ? Dudas sobre su cuidado y el del beb.   Siente la episiotoma o el desgarro vaginal caliente al tacto.   La episiotoma o el desgarro vaginal se abren o no parecen cicatrizar.   Siente dolor en las mamas, o estn duras o enrojecidas.   Siente tristeza o preocupacin de forma inusual.   Siente nuseas o vomita.   Elimina cogulos de sangre grandes por la vagina. Si expulsa un cogulo de sangre por la vagina, gurdelo para mostrrselo a su mdico. No tire la cadena sin que el mdico examine el cogulo de sangre antes.   Orina ms de lo habitual.   Se siente mareada o se desmaya.   No ha amamantado para nada y no ha tenido un perodo menstrual durante 12 semanas despus del parto.   Dej de amamantar al beb y no ha tenido su perodo menstrual durante 12 semanas despus de dejar de amamantar.  Solicite ayuda de inmediato si:   Tiene los siguientes sntomas:  ? Dolor que no desaparece o no mejora con medicamentos.  ? Dolor en el pecho.  ? Dificultad para respirar.  ? Visin borrosa o manchas en la vista.  ? Pensamientos de autolesionarse o lesionar al beb.   Comienza a sentir dolor en el abdomen o en una de las piernas.   Presenta un dolor de cabeza intenso.   Se desmaya.   Tiene una hemorragia de la vagina tan intensa que empapa dos toallitas sanitarias en una hora.  Esta informacin no tiene como fin  reemplazar el consejo del mdico. Asegrese de hacerle al mdico cualquier pregunta que tenga.  Document Released: 11/04/2005 Document Revised: 02/26/2017 Document Reviewed: 11/19/2015  Elsevier Interactive Patient Education  2018 [employer]

## 2019-02-22 IMAGING — US US MFM OB DETAIL+14 WK
1 series · 14 of 28 positions shown · non-contrast
Comparison: none

[Series 1: us mfm ob detail+14 wk · 128 acquisitions, 14 frames shown]
[im 5/128]
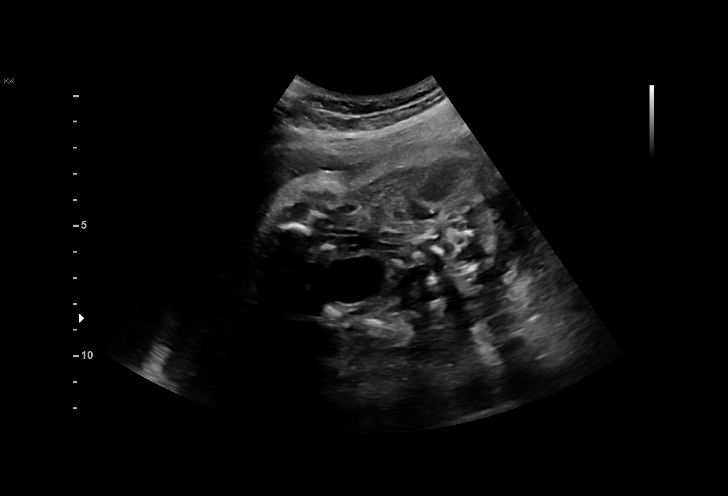
[im 15/128]
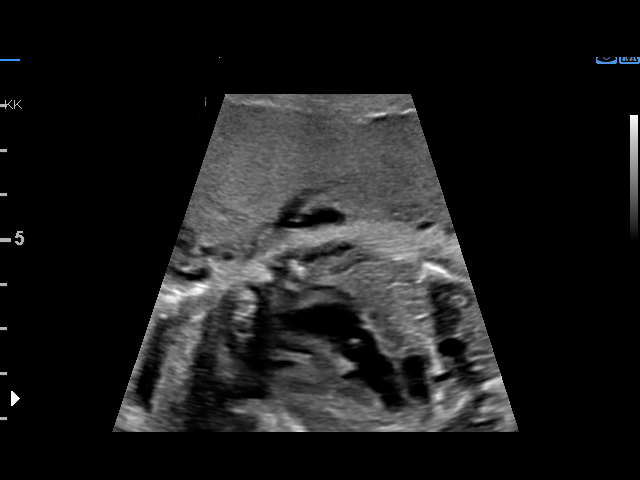
[im 24/128]
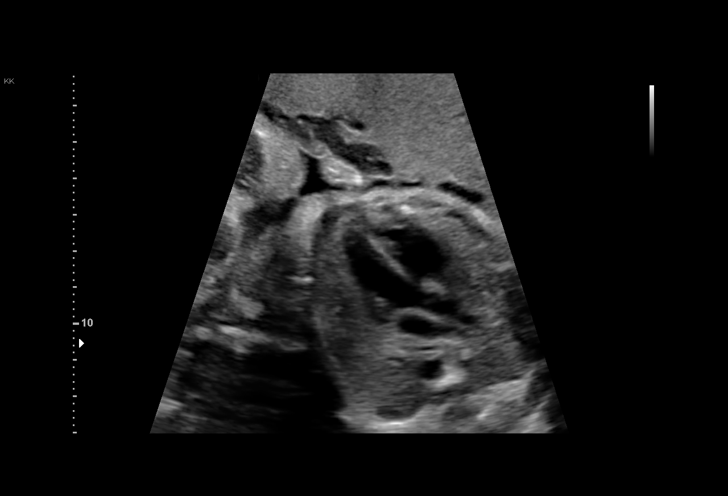
[im 33/128]
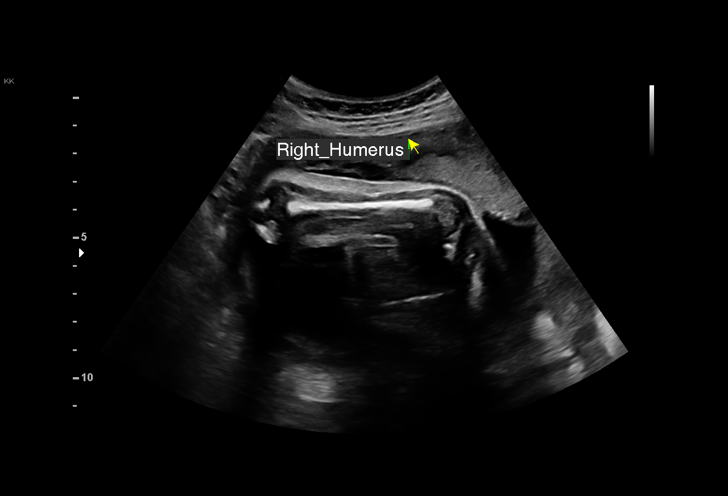
[im 43/128]
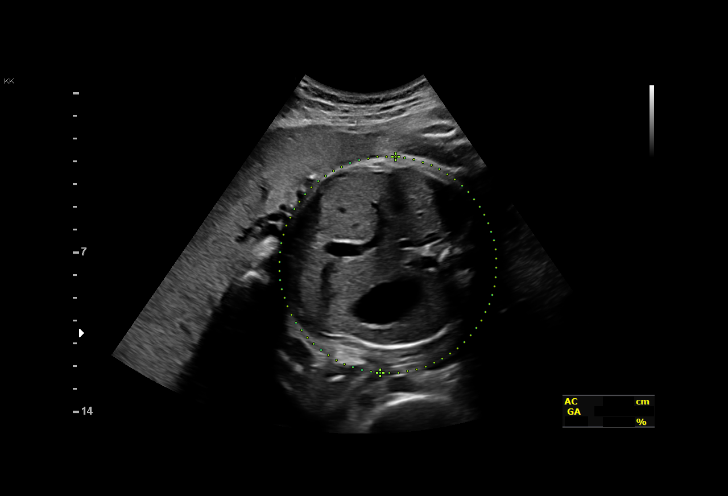
[im 52/128]
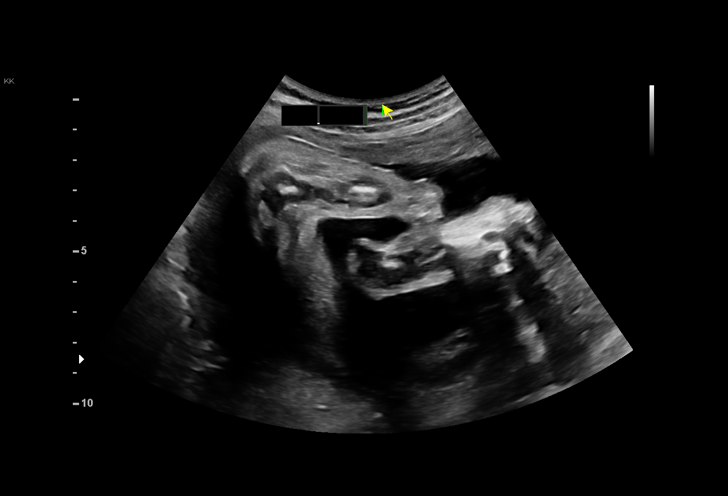
[im 62/128]
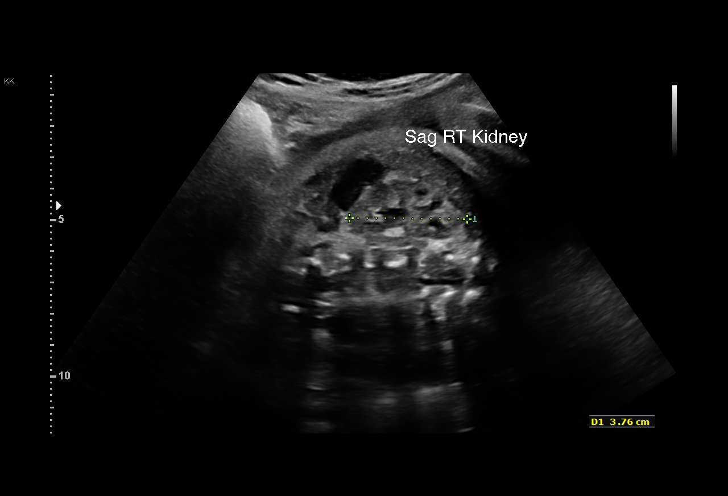
[im 71/128]
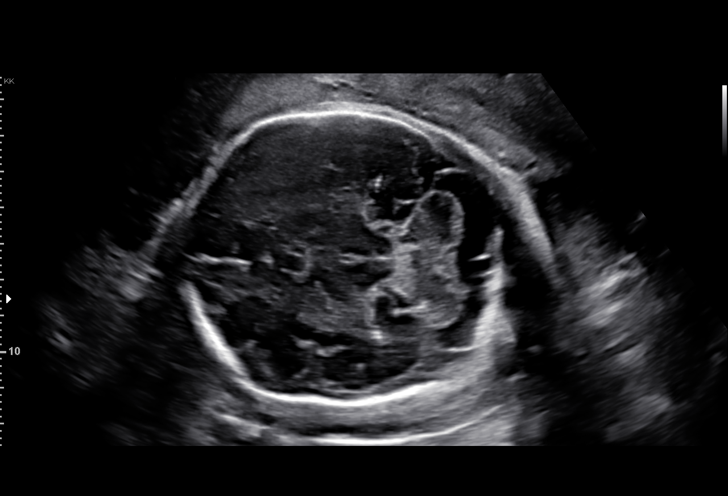
[im 80/128]
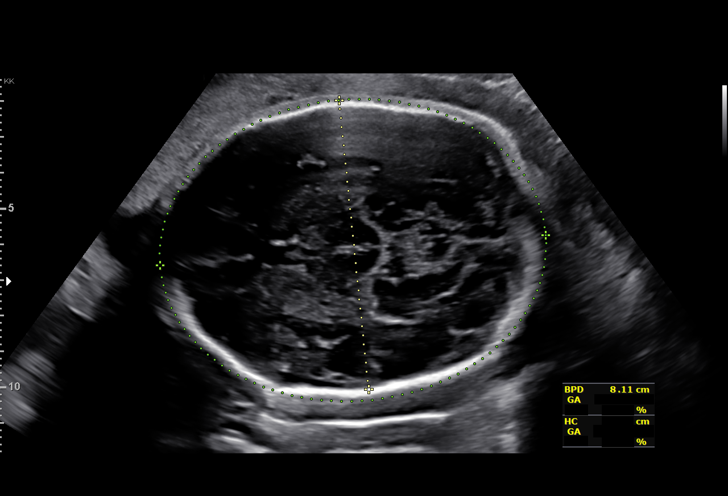
[im 90/128]
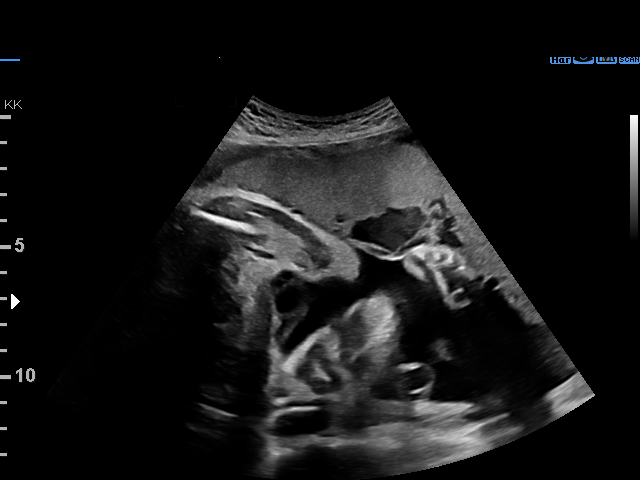
[im 99/128]
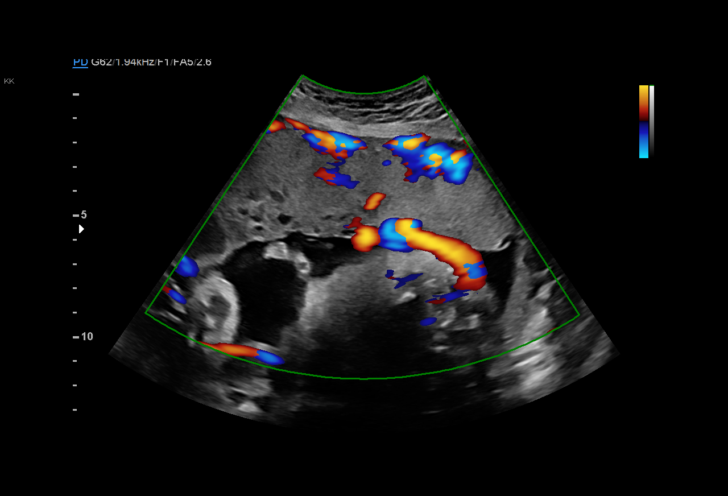
[im 109/128]
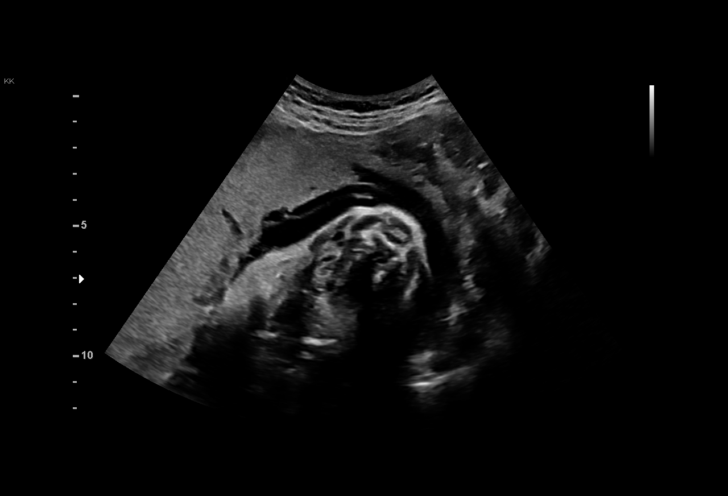
[im 118/128]
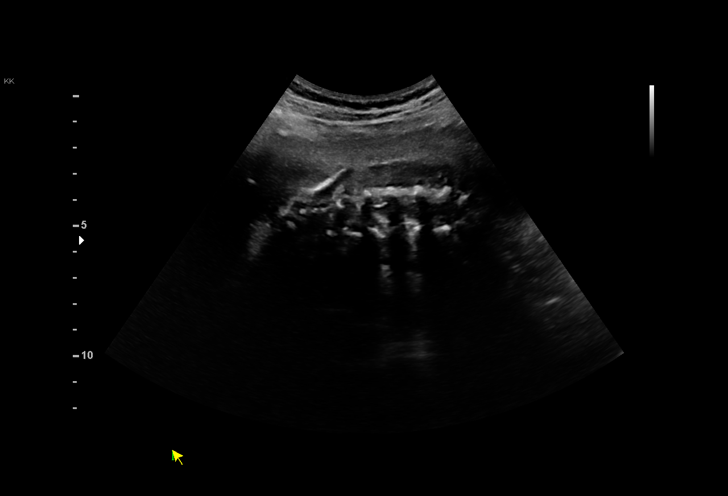
[im 128/128]
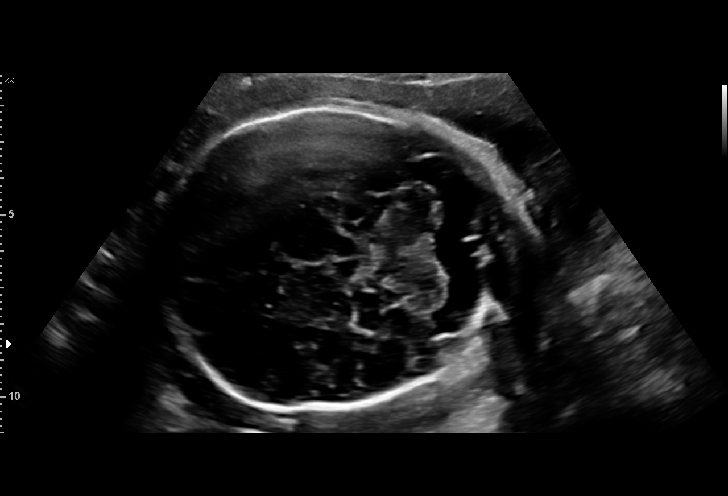

[14 of 28 positions shown; findings below may reference images not displayed]

[REDACTED] Health-
Faculty Physician

1  JIM              [id number]      [id number]     [id number]
Indications

33 weeks gestation of pregnancy
Encounter for antenatal screening for
malformations
Fetal abnormality - other known or
suspected (i.e. choriod plexus cyst, EIF,
renal pyelectasis)
Late to prenatal care, third trimester
OB History

Blood Type:            Height:         Weight (lb):  139       BMI:
Gravidity:    2         Term:   1
Living:       1
Fetal Evaluation

Num Of Fetuses:     1
Fetal Heart         143
Rate(bpm):
Cardiac Activity:   Observed
Presentation:       Cephalic
Placenta:           Anterior, above cervical os
P. Cord Insertion:  Visualized
Amniotic Fluid
AFI FV:      Subjectively within normal limits

AFI Sum(cm)     %Tile       Largest Pocket(cm)
12.62           37

RUQ(cm)       RLQ(cm)       LUQ(cm)        LLQ(cm)
4.28
Biometry

BPD:      81.5  mm     G. Age:  32w 5d         36  %    CI:        72.56   %    70 - 86
FL/HC:      20.4   %    19.9 -
HC:      304.3  mm     G. Age:  33w 6d         35  %    HC/AC:      1.01        0.96 -
AC:      300.5  mm     G. Age:  34w 0d         78  %    FL/BPD:     76.1   %    71 - 87
FL:         62  mm     G. Age:  32w 1d         19  %    FL/AC:      20.6   %    20 - 24
HUM:      53.2  mm     G. Age:  31w 0d         19  %

Est. FW:    4032  gm    4 lb 13 oz      65  %
Gestational Age

LMP:           33w 0d        Date:  09/25/17                 EDD:   07/02/18
U/S Today:     33w 1d                                        EDD:   07/01/18
Best:          33w 0d     Det. By:  LMP  (09/25/17)          EDD:   07/02/18
Anatomy

Cranium:               Appears normal         Aortic Arch:            Appears normal
Cavum:                 Appears normal         Ductal Arch:            Appears normal
Ventricles:            Appears normal         Diaphragm:              Appears normal
Choroid Plexus:        Appears normal         Stomach:                Appears normal, left
sided
Cerebellum:            Appears normal         Abdomen:                Appears normal
Posterior Fossa:       Enlarged cisternal     Abdominal Wall:         Not well visualized
magna,1.1  mm
Nuchal Fold:           Not applicable (>20    Cord Vessels:           Appears normal (3
wks GA)                                        vessel cord)
Face:                  Appears normal         Kidneys:                Appear normal
(orbits and profile)
Lips:                  Appears normal         Bladder:                Appears normal
Thoracic:              Appears normal         Spine:                  Limited views
appear normal
Heart:                 Appears normal         Upper Extremities:      Appears normal
(4CH, axis, and situs
RVOT:                  Appears normal         Lower Extremities:      Appears normal
LVOT:                  Appears normal

Other:  Male gender. Left Heel visualized. Technically difficult due to
advanced GA and fetal position.
Cervix Uterus Adnexa

Cervix
Not visualized (advanced GA >53wks)
Impression

Singleton intrauterine pregnancy at 33+0 weeks
Review of the anatomy shows a slightly enlarged cisterna
magna;otherwise no sonographic markers for aneuploidy or
structural anomalies are seen
All relevant fetal anatomy has been visualized
Amniotic fluid volume is normal
Estimated fetal weight shows growth in the 65th percentile
Recommendations

Discussed findings with the patient. Isolated megacisterna
magna is almost always a nonpathologic finding.
Repeat scan in 4 weeks to reassess poaterior fossa

## 2023-12-26 ENCOUNTER — Other Ambulatory Visit: Payer: Self-pay | Admitting: Internal Medicine

## 2023-12-26 DIAGNOSIS — R7989 Other specified abnormal findings of blood chemistry: Secondary | ICD-10-CM

## 2024-01-15 ENCOUNTER — Ambulatory Visit
Admission: RE | Admit: 2024-01-15 | Discharge: 2024-01-15 | Disposition: A | Discharge status: Home / Self Care | Payer: No Typology Code available for payment source | Source: Ambulatory Visit | Attending: Internal Medicine | Admitting: Internal Medicine

## 2024-01-15 DIAGNOSIS — R7989 Other specified abnormal findings of blood chemistry: Secondary | ICD-10-CM

## 2024-09-16 ENCOUNTER — Encounter: Admitting: Obstetrics

## 2024-10-07 ENCOUNTER — Ambulatory Visit: Admitting: Obstetrics and Gynecology

## 2024-10-07 ENCOUNTER — Encounter: Admitting: Obstetrics and Gynecology

## 2024-10-07 ENCOUNTER — Encounter: Payer: Self-pay | Admitting: Obstetrics and Gynecology

## 2024-10-07 VITALS — BP 138/86 | HR 76 | Ht 62.0 in | Wt 195.0 lb

## 2024-10-07 DIAGNOSIS — Z3202 Encounter for pregnancy test, result negative: Secondary | ICD-10-CM | POA: Diagnosis not present

## 2024-10-07 DIAGNOSIS — N970 Female infertility associated with anovulation: Secondary | ICD-10-CM

## 2024-10-07 DIAGNOSIS — E119 Type 2 diabetes mellitus without complications: Secondary | ICD-10-CM

## 2024-10-07 LAB — POCT URINE PREGNANCY: Preg Test, Ur: NEGATIVE

## 2024-10-07 NOTE — H&P (Signed)
 S: Kristin Cobb is a 28 year old G2P2 with PMH T2DM who presents to establish care with concerns of decreased fertility. She notes that she has been having unprotected intercourse every other day for 2 years with conception. She notes that her LMP was 06/2024 and she has not had once since. She gives the history that she has a PMH of diagnosed ovarian cysts on an US  she had in [place] and also that she has had irregular periods since her last vaginal delivery in 2019. She notes that since 2019, her periods have been every 6 months to 1/year. Her last pap smear was in 2024 and was normal; she is not due for this until 2026. She denies bleeding, fatigue, breast tenderness, nausea, vomiting, diarrhea, constipation, dyspareunia, or dysuria.   Of note, upon EMR review of patient records with health department and [facility], patient has previous diagnosis of T2DM through A1C 03/2024 with most recent result 6.8. Patient has not been on medication for this, and upon further questioning is not aware of a diagnosis of T2DM.   O:  Vitals:   10/07/24 0818  BP: 138/86  Pulse: 76   Physical Exam Constitutional:      Appearance: Normal appearance. She is obese.  Pulmonary:     Effort: Pulmonary effort is normal.  Neurological:     Mental Status: She is alert and oriented to person, place, and time.  Psychiatric:        Mood and Affect: Mood normal.        Behavior: Behavior normal.        Thought Content: Thought content normal.     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 By information gathered in history and EMR, it seems that uncontrolled T2DM and worsening insulin resistance could be factors contributing to her cycle irregularities and lack of pregnancy despite consistent unprotected intercourse. It would be important to initiate steps to get better control of blood sugar while doing other lab work to  assess for other factors contributing to lack of regular menses. Patient also expressed preference to have a pelvic US  done today; with shared decision making, this was ordered as well. If after 6 month follow up patient is still having irregular menses, will consider initiation of ovulation agent.   P: - diabetic education referral - nutrition referral - TSH blood work - LH/FSH blood work - pelvic US  complete with transvaginal - POC UPT - 6 month f/u

## 2024-10-07 NOTE — Progress Notes (Signed)
 S: Kristin Cobb is a 28 year old G2P2 with PMH T2DM who presents to establish care with concerns of decreased fertility. She notes that she has been having unprotected intercourse every other day for 2 years with conception. She notes that her LMP was 06/2024 and she has not had once since. She gives the history that she has a PMH of diagnosed ovarian cysts on an US  she had in [place] and also that she has had irregular periods since her last vaginal delivery in 2019. She notes that since 2019, her periods have been every 6 months to 1/year. Her last pap smear was in 2024 and was normal; she is not due for this until 2026. She denies bleeding, fatigue, breast tenderness, nausea, vomiting, diarrhea, constipation, dyspareunia, or dysuria.   Of note, upon EMR review of patient records with health department and [facility], patient has previous diagnosis of T2DM through A1C 03/2024 with most recent result 6.8. Patient has not been on medication for this, and upon further questioning is not aware of a diagnosis of T2DM.   O:  Vitals:   10/07/24 0818  BP: 138/86  Pulse: 76   Physical Exam Constitutional:      Appearance: Normal appearance. She is obese.  Pulmonary:     Effort: Pulmonary effort is normal.  Neurological:     Mental Status: She is alert and oriented to person, place, and time.  Psychiatric:        Mood and Affect: Mood normal.        Behavior: Behavior normal.        Thought Content: Thought content normal.     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 By information gathered in history and EMR, it seems that uncontrolled T2DM and worsening insulin resistance could be factors contributing to her cycle irregularities and lack of pregnancy despite consistent unprotected intercourse. It would be important to initiate steps to get better control of blood sugar while doing other lab work to  assess for other factors contributing to lack of regular menses. Patient also expressed preference to have a pelvic US  done today; with shared decision making, this was ordered as well. If after 6 month follow up patient is still having irregular menses, will consider initiation of ovulation agent.   P: - diabetic education referral - nutrition referral - TSH blood work - LH/FSH blood work - pelvic US  complete with transvaginal - POC UPT - 6 month f/u

## 2024-10-07 NOTE — Progress Notes (Signed)
 28 y.o. New GYN presents for Fertility.  Pt is trying to conceive, x 2 years.  UPT  Negative

## 2024-10-08 ENCOUNTER — Encounter: Payer: Self-pay | Admitting: Obstetrics and Gynecology

## 2024-10-13 ENCOUNTER — Ambulatory Visit (HOSPITAL_COMMUNITY)
Admission: RE | Admit: 2024-10-13 | Discharge: 2024-10-13 | Disposition: A | Discharge status: Home / Self Care | Source: Ambulatory Visit | Attending: Obstetrics and Gynecology | Admitting: Obstetrics and Gynecology

## 2024-10-13 DIAGNOSIS — N970 Female infertility associated with anovulation: Secondary | ICD-10-CM | POA: Insufficient documentation

## 2024-10-15 LAB — TSH+PRL+FSH+TESTT+LH+DHEA S...
17-Hydroxyprogesterone: 116 ng/dL
Androstenedione: 334 ng/dL — ABNORMAL HIGH (ref 41–262)
DHEA-SO4: 97.6 ug/dL (ref 84.8–378.0)
FSH: 6 m[IU]/mL
LH: 10 m[IU]/mL
Prolactin: 12 ng/mL (ref 4.8–33.4)
TSH: 1.57 u[IU]/mL (ref 0.450–4.500)
Testosterone, Free: 1.7 pg/mL (ref 0.0–4.2)
Testosterone: 68 ng/dL (ref 13–71)

## 2024-10-26 ENCOUNTER — Ambulatory Visit: Payer: Self-pay | Admitting: Obstetrics and Gynecology

## 2024-11-24 ENCOUNTER — Encounter: Admitting: Dietician

## 2024-12-02 ENCOUNTER — Encounter: Admitting: Dietician

## 2024-12-27 ENCOUNTER — Encounter: Admitting: Skilled Nursing Facility1
